# Patient Record
Sex: Female | Born: 1950 | Race: White | Hispanic: No | Smoking: Current every day smoker
Health system: Southern US, Community
[De-identification: ages and names within clinical notes are randomized; demographics above are authoritative.]

## PROBLEM LIST (undated history)

## (undated) DIAGNOSIS — M549 Dorsalgia, unspecified: Secondary | ICD-10-CM

## (undated) DIAGNOSIS — Z13820 Encounter for screening for osteoporosis: Secondary | ICD-10-CM

## (undated) DIAGNOSIS — N6011 Diffuse cystic mastopathy of right breast: Secondary | ICD-10-CM

## (undated) DIAGNOSIS — Z1231 Encounter for screening mammogram for malignant neoplasm of breast: Secondary | ICD-10-CM

## (undated) DIAGNOSIS — D721 Eosinophilia, unspecified: Secondary | ICD-10-CM

## (undated) DIAGNOSIS — N6012 Diffuse cystic mastopathy of left breast: Secondary | ICD-10-CM

## (undated) DIAGNOSIS — M4820 Kissing spine, site unspecified: Secondary | ICD-10-CM

## (undated) HISTORY — PX: ABDOMINAL HYSTERECTOMY: SHX81

---

## 2016-11-03 ENCOUNTER — Other Ambulatory Visit: Payer: Self-pay | Admitting: Obstetrics and Gynecology

## 2016-11-03 DIAGNOSIS — R5381 Other malaise: Secondary | ICD-10-CM

## 2016-11-03 DIAGNOSIS — Z1231 Encounter for screening mammogram for malignant neoplasm of breast: Secondary | ICD-10-CM

## 2016-11-08 ENCOUNTER — Other Ambulatory Visit: Payer: Self-pay | Admitting: Obstetrics and Gynecology

## 2016-11-08 DIAGNOSIS — M8580 Other specified disorders of bone density and structure, unspecified site: Secondary | ICD-10-CM

## 2016-12-02 ENCOUNTER — Ambulatory Visit
Admission: RE | Admit: 2016-12-02 | Discharge: 2016-12-02 | Disposition: A | Discharge status: Home / Self Care | Payer: BLUE CROSS/BLUE SHIELD | Source: Ambulatory Visit | Attending: Obstetrics and Gynecology | Admitting: Obstetrics and Gynecology

## 2016-12-02 DIAGNOSIS — M8580 Other specified disorders of bone density and structure, unspecified site: Secondary | ICD-10-CM

## 2016-12-02 DIAGNOSIS — Z1231 Encounter for screening mammogram for malignant neoplasm of breast: Secondary | ICD-10-CM

## 2017-05-25 DIAGNOSIS — J189 Pneumonia, unspecified organism: Secondary | ICD-10-CM | POA: Diagnosis not present

## 2018-07-02 ENCOUNTER — Encounter: Payer: Self-pay | Admitting: Emergency Medicine

## 2018-07-02 ENCOUNTER — Emergency Department (INDEPENDENT_AMBULATORY_CARE_PROVIDER_SITE_OTHER): Payer: Medicare HMO

## 2018-07-02 ENCOUNTER — Emergency Department
Admission: EM | Admit: 2018-07-02 | Discharge: 2018-07-02 | Disposition: A | Discharge status: Home / Self Care | Payer: Medicare HMO | Source: Home / Self Care

## 2018-07-02 ENCOUNTER — Other Ambulatory Visit: Payer: Self-pay

## 2018-07-02 ENCOUNTER — Telehealth: Payer: Self-pay | Admitting: Emergency Medicine

## 2018-07-02 DIAGNOSIS — M79641 Pain in right hand: Secondary | ICD-10-CM | POA: Diagnosis not present

## 2018-07-02 DIAGNOSIS — R918 Other nonspecific abnormal finding of lung field: Secondary | ICD-10-CM

## 2018-07-02 DIAGNOSIS — R079 Chest pain, unspecified: Secondary | ICD-10-CM | POA: Diagnosis not present

## 2018-07-02 DIAGNOSIS — R0789 Other chest pain: Secondary | ICD-10-CM

## 2018-07-02 MED ORDER — DICLOFENAC SODIUM 75 MG PO TBEC: 75.0000 mg | DELAYED_RELEASE_TABLET | Freq: Two times a day (BID) | ORAL | 0 refills | Status: AC

## 2018-07-02 MED ORDER — METHOCARBAMOL 500 MG PO TABS: 500.0000 mg | ORAL_TABLET | Freq: Two times a day (BID) | ORAL | 0 refills | Status: AC

## 2018-07-02 NOTE — Telephone Encounter (Signed)
Calling to say she does not have PCP and her insurance is cancelled as of 07/04/18. Told her we would see if we can find a Horticulturist, commercialcommunity health resource.

## 2018-07-02 NOTE — ED Triage Notes (Signed)
67 y.o female c/o left sided breast/chest pain. States this began two days ago after repetitively trying to start weed eater. States the pain is a dull ache and worsens upon taking a deep breathe. She is taking Tylenol PRN for the pain.

## 2018-07-02 NOTE — Discharge Instructions (Signed)
Schedule primary care appointment.  You need a follow up ct or chest xray.

## 2018-07-03 NOTE — ED Provider Notes (Signed)
[facility]    CSN: [id number] Arrival date & time: 07/02/18  0830     History   Chief Complaint Chief Complaint  Patient presents with  . Chest Pain    injured while starting weed eater 2 days ago    HPI Audrey Paul is a 67 y.o. female.   The history is provided by the patient. No language interpreter was used.  Chest Pain  Pain location:  L chest Pain quality: aching   Pain radiates to:  Does not radiate Timing:  Constant Progression:  Worsening Chronicity:  New Relieved by:  Nothing Worsened by:  Nothing Ineffective treatments:  None tried Pt reports she was pulling weed eater sting and has had pain in her chest since.   Pt reports pain with  A deep breath History reviewed. No pertinent past medical history.  There are no active problems to display for this patient.   Past Surgical History:  Procedure Laterality Date  . ABDOMINAL HYSTERECTOMY      OB History   None      Home Medications    Prior to Admission medications   Medication Sig Start Date End Date Taking? Authorizing Provider  diclofenac (VOLTAREN) 75 MG EC tablet Take 1 tablet (75 mg total) by mouth 2 (two) times daily. 07/02/18   Elson Areas, PA-C  methocarbamol (ROBAXIN) 500 MG tablet Take 1 tablet (500 mg total) by mouth 2 (two) times daily. 07/02/18   Elson Areas, PA-C    Family History Family History  Problem Relation Age of Onset  . Hypertension Mother   . Hypertension Father     Social History Social History   Tobacco Use  . Smoking status: Current Every Day Smoker    Types: Cigarettes  . Smokeless tobacco: Never Used  Substance Use Topics  . Alcohol use: Not Currently  . Drug use: Not Currently     Allergies   Zithromax [azithromycin]   Review of Systems Review of Systems  Cardiovascular: Positive for chest pain.  All other systems reviewed and are negative.    Physical Exam Triage Vital Signs ED Triage Vitals  Enc Vitals Group   BP 07/02/18 0851 120/78     Pulse Rate 07/02/18 0851 69     Resp --      Temp 07/02/18 0851 (!) 97.4 F (36.3 C)     Temp Source 07/02/18 0851 Oral     SpO2 07/02/18 0851 100 %     Weight 07/02/18 0852 98 lb 4.8 oz (44.6 kg)     Height --      Head Circumference --      Peak Flow --      Pain Score 07/02/18 0852 5     Pain Loc --      Pain Edu? --      Excl. in GC? --    No data found.  Updated Vital Signs BP 120/78 (BP Location: Right Arm)   Pulse 69   Temp (!) 97.4 F (36.3 C) (Oral)   Wt 98 lb 4.8 oz (44.6 kg)   SpO2 100%   Visual Acuity Right Eye Distance:   Left Eye Distance:   Bilateral Distance:    Right Eye Near:   Left Eye Near:    Bilateral Near:     Physical Exam  Constitutional: She is oriented to person, place, and time. She appears well-developed and well-nourished.  HENT:  Head: Normocephalic and atraumatic.  Eyes: EOM are normal.  Neck: Normal range of motion.  Cardiovascular: Normal rate, regular rhythm and normal pulses.  Pulmonary/Chest: She is in respiratory distress.  Abdominal: She exhibits no distension.  Musculoskeletal: Normal range of motion.  Neurological: She is alert and oriented to person, place, and time.  Psychiatric: She has a normal mood and affect.  Nursing note and vitals reviewed.    UC Treatments / Results  Labs (all labs ordered are listed, but only abnormal results are displayed) Labs Reviewed - No data to display  EKG None  Radiology Dg Chest 2 View  Result Date: 07/02/2018 CLINICAL DATA:  Chest pain for 1 week.  History of smoking. EXAM: CHEST - 2 VIEW COMPARISON:  None. FINDINGS: Heart size and mediastinal contours are within normal limits. Lungs are hyperexpanded. Coarse markings are seen throughout both lungs suggesting chronic interstitial lung disease. Small irregular nodular density within the LEFT upper lung. No confluent opacity to suggest a developing pneumonia. No pleural effusion or pneumothorax seen.  Mild degenerative spondylosis within the thoracic spine. Dextroscoliosis of the thoracic spine, mild to moderate in degree. No acute or suspicious osseous finding. IMPRESSION: 1. Small irregular nodular density within the LEFT upper lung, neoplastic nodule versus nodular scarring/fibrosis. Recommend chest CT for further characterization. 2. Hyperexpanded lungs indicating COPD. Probable associated chronic interstitial lung disease. Electronically Signed   By: Bary Richard M.D.   On: 07/02/2018 09:33   Dg Hand Complete Right  Result Date: 07/02/2018 CLINICAL DATA:  67 year old female with persistent right hand pain after breaking up fight between 2 dogs 7 weeks previously. EXAM: RIGHT HAND - COMPLETE 3+ VIEW COMPARISON:  None. FINDINGS: There is no evidence of fracture or dislocation. There is no evidence of arthropathy or other focal bone abnormality. Soft tissues are unremarkable. IMPRESSION: Negative. Electronically Signed   By: Malachy Moan M.D.   On: 07/02/2018 09:27    Procedures Procedures (including critical care time)  Medications Ordered in UC Medications - No data to display  Initial Impression / Assessment and Plan / UC Course  I have reviewed the triage vital signs and the nursing notes.  Pertinent labs & imaging results that were available during my care of the patient were reviewed by me and considered in my medical decision making (see chart for details).     Patient advised of lung nodule found on chest x-ray she reports she has been told in the past that she had a nodule but she was unable to follow-up.  I suspect patient's current pain is muscular I will treat her with Voltaren and Robaxin.  Patient is a [facility] resident.  Resources are obtained for referrals to [facility] clincs Final Clinical Impressions(s) / UC Diagnoses   Final diagnoses:  Abnormal chest x-ray with multiple lung nodules  Chest wall pain     Discharge Instructions     Schedule  primary care appointment.  You need a follow up ct or chest xray.    ED Prescriptions    Medication Sig Dispense Auth. Provider   diclofenac (VOLTAREN) 75 MG EC tablet Take 1 tablet (75 mg total) by mouth 2 (two) times daily. 20 tablet Ramaj Frangos K, [place]   methocarbamol (ROBAXIN) 500 MG tablet Take 1 tablet (500 mg total) by mouth 2 (two) times daily. 20 tablet Elson Areas, [place]     Controlled Substance Prescriptions [place] Controlled Substance Registry consulted? Not Applicable  An After Visit Summary was printed and given to the patient.    Elson Areas,  PA-C 07/03/18 1119

## 2019-01-22 DIAGNOSIS — Z13820 Encounter for screening for osteoporosis: Secondary | ICD-10-CM

## 2019-01-23 ENCOUNTER — Inpatient Hospital Stay: Admit: 2019-01-23 | Payer: MEDICARE | Primary: Family Medicine

## 2019-01-23 LAB — COMPREHENSIVE METABOLIC PANEL
ALT: 32 U/L (ref 12–78)
AST: 20 U/L (ref 15–37)
Albumin: 4.3 gm/dl (ref 3.4–5.0)
Alkaline Phosphatase: 58 U/L (ref 45–117)
Anion Gap: 4 mmol/L — ABNORMAL LOW (ref 5–15)
BUN: 15 mg/dl (ref 7–25)
CO2: 29 mEq/L (ref 21–32)
Calcium: 8.9 mg/dl (ref 8.5–10.1)
Chloride: 109 mEq/L — ABNORMAL HIGH (ref 98–107)
Creatinine: 0.7 mg/dl (ref 0.6–1.3)
EGFR IF NonAfrican American: >60
GFR African American: >60
Glucose: 91 mg/dl (ref 74–106)
Potassium: 4.8 mEq/L (ref 3.5–5.1)
Sodium: 142 mEq/L (ref 136–145)
Total Bilirubin: 1 mg/dl (ref 0.2–1.0)
Total Protein: 6.6 gm/dl (ref 6.4–8.2)

## 2019-01-23 LAB — CBC WITH AUTO DIFFERENTIAL
Basophils %: 1.1 % (ref 0–3)
Eosinophils %: 15.2 % — ABNORMAL HIGH (ref 0–5)
Hematocrit: 39.5 % (ref 37.0–50.0)
Hemoglobin: 13.2 gm/dl (ref 13.0–17.2)
Immature Granulocytes: 0.2 % (ref 0.0–3.0)
Lymphocytes %: 28.8 % (ref 28–48)
MCH: 31.6 pg (ref 25.4–34.6)
MCHC: 33.4 gm/dl (ref 30.0–36.0)
MCV: 94.5 fL (ref 80.0–98.0)
MPV: 9.9 fL (ref 6.0–10.0)
Monocytes %: 6.2 % (ref 1–13)
Neutrophils %: 48.5 % (ref 34–64)
Nucleated RBCs: 0 (ref 0–0)
Platelets: 258 10*3/uL (ref 140–450)
RBC: 4.18 M/uL (ref 3.60–5.20)
RDW-SD: 46.6 — ABNORMAL HIGH (ref 36.4–46.3)
WBC: 5.3 10*3/uL (ref 4.0–11.0)

## 2019-01-23 LAB — LIPID PANEL
CHOL/HDL Ratio: 3.5 Ratio (ref 0.0–4.4)
Chol/HDL Ratio: 3.5 Ratio (ref 0.0–4.4)
Cholesterol, Total: 202 mg/dl — ABNORMAL HIGH (ref 140–199)
Cholesterol, total: 202 mg/dl — ABNORMAL HIGH (ref 140–199)
HDL Cholesterol: 58 mg/dl (ref 40–96)
HDL: 58 mg/dl (ref 40–96)
LDL Calculated: 122 mg/dl (ref 0–130)
LDL, calculated: 122 mg/dl (ref 0–130)
Triglyceride: 111 mg/dl (ref 29–150)
Triglycerides: 111 mg/dl (ref 29–150)

## 2019-01-23 LAB — VITAMIN D 25 HYDROXY: Vitamin D, 25-OH, Total: 38.7 ng/ml (ref 30.0–100.0)

## 2019-01-23 LAB — THYROID CASCADE PROFILE
TSH: 2.27 m[IU]/mL (ref 0.358–3.740)
TSH: 2.27 m[IU]/mL (ref 0.358–3.740)

## 2019-01-23 LAB — CBC WITH AUTOMATED DIFF
BASOPHILS: 1.1 % (ref 0–3)
EOSINOPHILS: 15.2 % — ABNORMAL HIGH (ref 0–5)
HCT: 39.5 % (ref 37.0–50.0)
HGB: 13.2 gm/dl (ref 13.0–17.2)
IMMATURE GRANULOCYTES: 0.2 % (ref 0.0–3.0)
LYMPHOCYTES: 28.8 % (ref 28–48)
MCH: 31.6 pg (ref 25.4–34.6)
MCHC: 33.4 gm/dl (ref 30.0–36.0)
MCV: 94.5 fL (ref 80.0–98.0)
MONOCYTES: 6.2 % (ref 1–13)
MPV: 9.9 fL (ref 6.0–10.0)
NEUTROPHILS: 48.5 % (ref 34–64)
NRBC: 0 (ref 0–0)
PLATELET: 258 10*3/uL (ref 140–450)
RBC: 4.18 M/uL (ref 3.60–5.20)
RDW-SD: 46.6 — ABNORMAL HIGH (ref 36.4–46.3)
WBC: 5.3 10*3/uL (ref 4.0–11.0)

## 2019-01-23 LAB — METABOLIC PANEL, COMPREHENSIVE
ALT (SGPT): 32 U/L (ref 12–78)
AST (SGOT): 20 U/L (ref 15–37)
Albumin: 4.3 gm/dl (ref 3.4–5.0)
Alk. phosphatase: 58 U/L (ref 45–117)
Anion gap: 4 mmol/L — ABNORMAL LOW (ref 5–15)
BUN: 15 mg/dl (ref 7–25)
Bilirubin, total: 1 mg/dl (ref 0.2–1.0)
CO2: 29 mEq/L (ref 21–32)
Calcium: 8.9 mg/dl (ref 8.5–10.1)
Chloride: 109 mEq/L — ABNORMAL HIGH (ref 98–107)
Creatinine: 0.7 mg/dl (ref 0.6–1.3)
GFR est AA: >60
GFR est non-AA: >60
Glucose: 91 mg/dl (ref 74–106)
Potassium: 4.8 mEq/L (ref 3.5–5.1)
Protein, total: 6.6 gm/dl (ref 6.4–8.2)
Sodium: 142 mEq/L (ref 136–145)

## 2019-01-23 LAB — VITAMIN D, 25 HYDROXY: Vitamin D, 25-OH, Total: 38.7 ng/ml (ref 30.0–100.0)

## 2019-01-24 ENCOUNTER — Encounter

## 2019-01-25 ENCOUNTER — Encounter

## 2019-02-08 ENCOUNTER — Inpatient Hospital Stay: Admit: 2019-02-08 | Payer: MEDICARE | Attending: Family Medicine | Primary: Family Medicine

## 2019-02-08 ENCOUNTER — Inpatient Hospital Stay: Payer: MEDICARE | Attending: Family Medicine | Primary: Family Medicine

## 2019-02-08 DIAGNOSIS — N6011 Diffuse cystic mastopathy of right breast: Secondary | ICD-10-CM

## 2019-06-26 IMAGING — DX DG CHEST 2V
2 series · 2 of 2 positions shown · non-contrast
Comparison: None.

CLINICAL DATA: Chest pain for 1 week.  History of smoking.

EXAM:
CHEST - 2 VIEW

[chest pa]
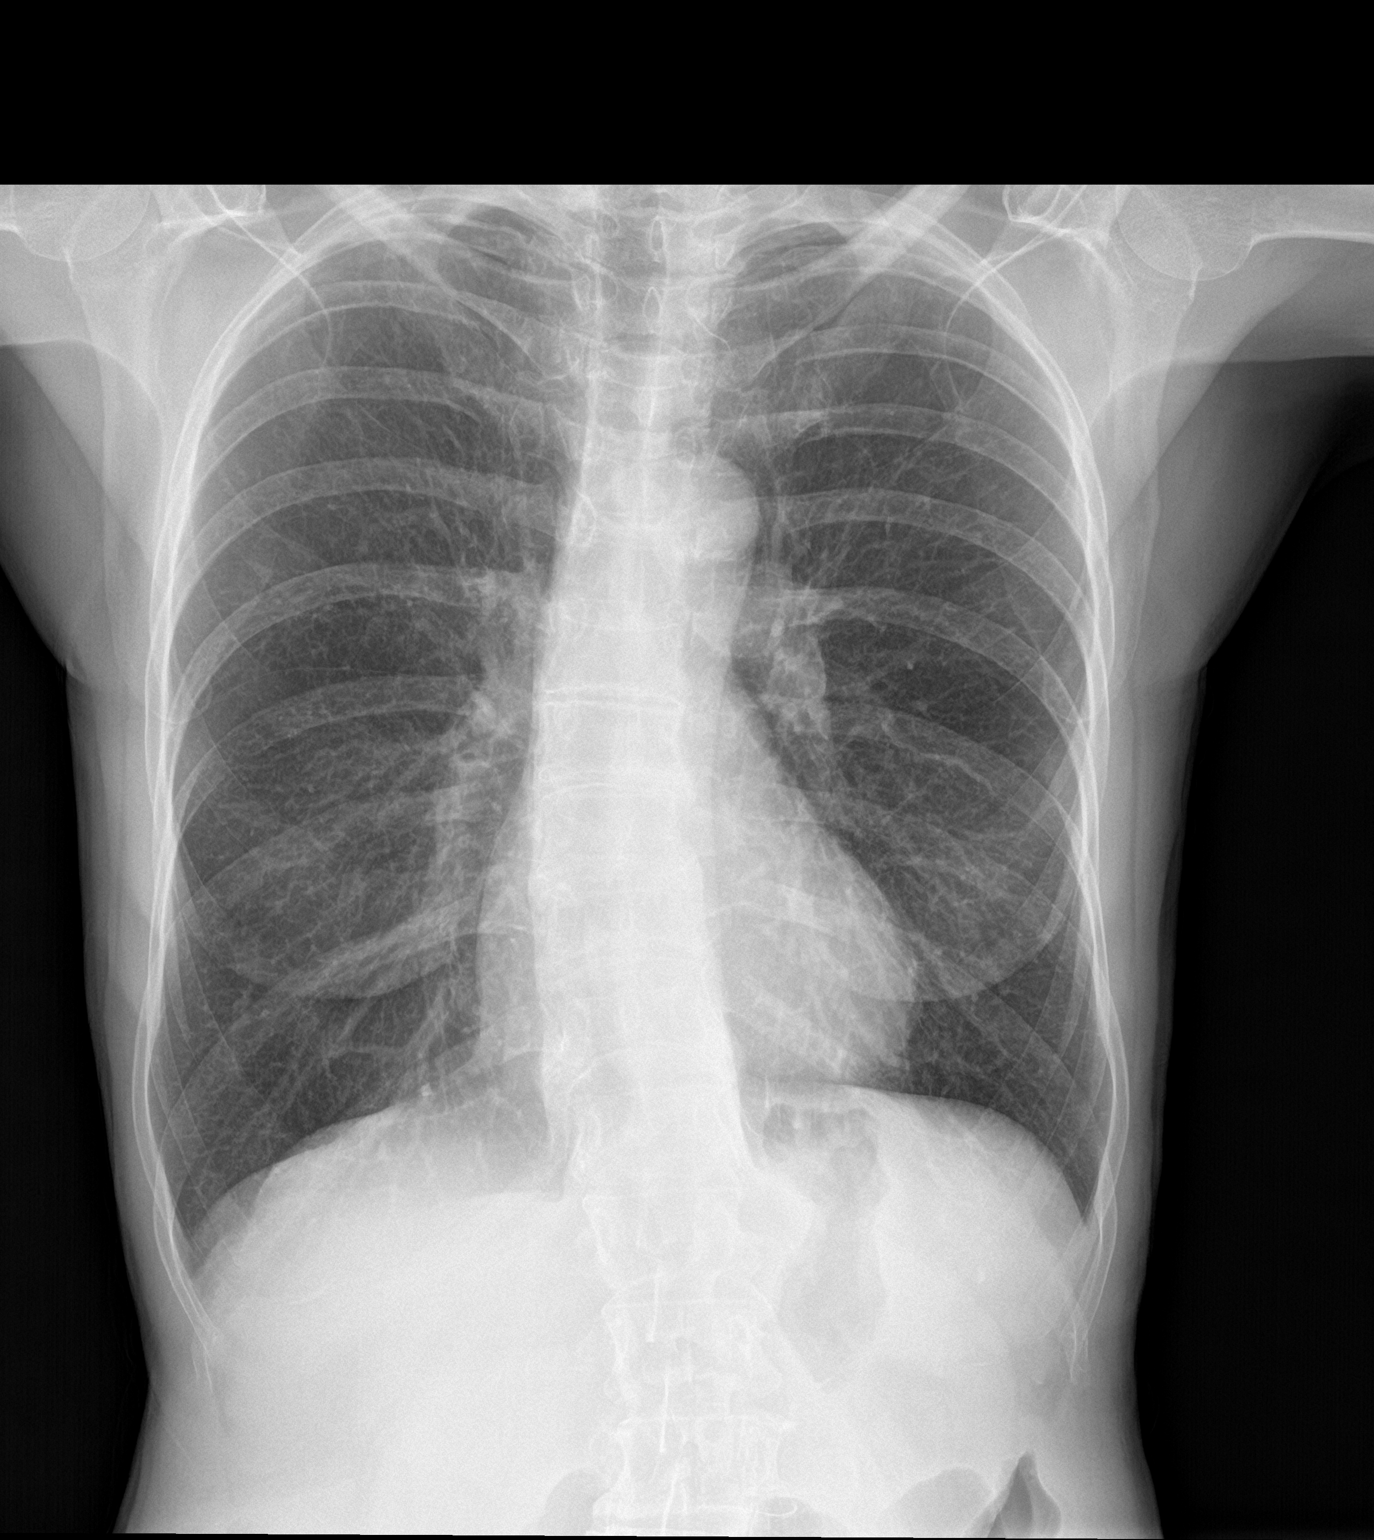

[chest lat]
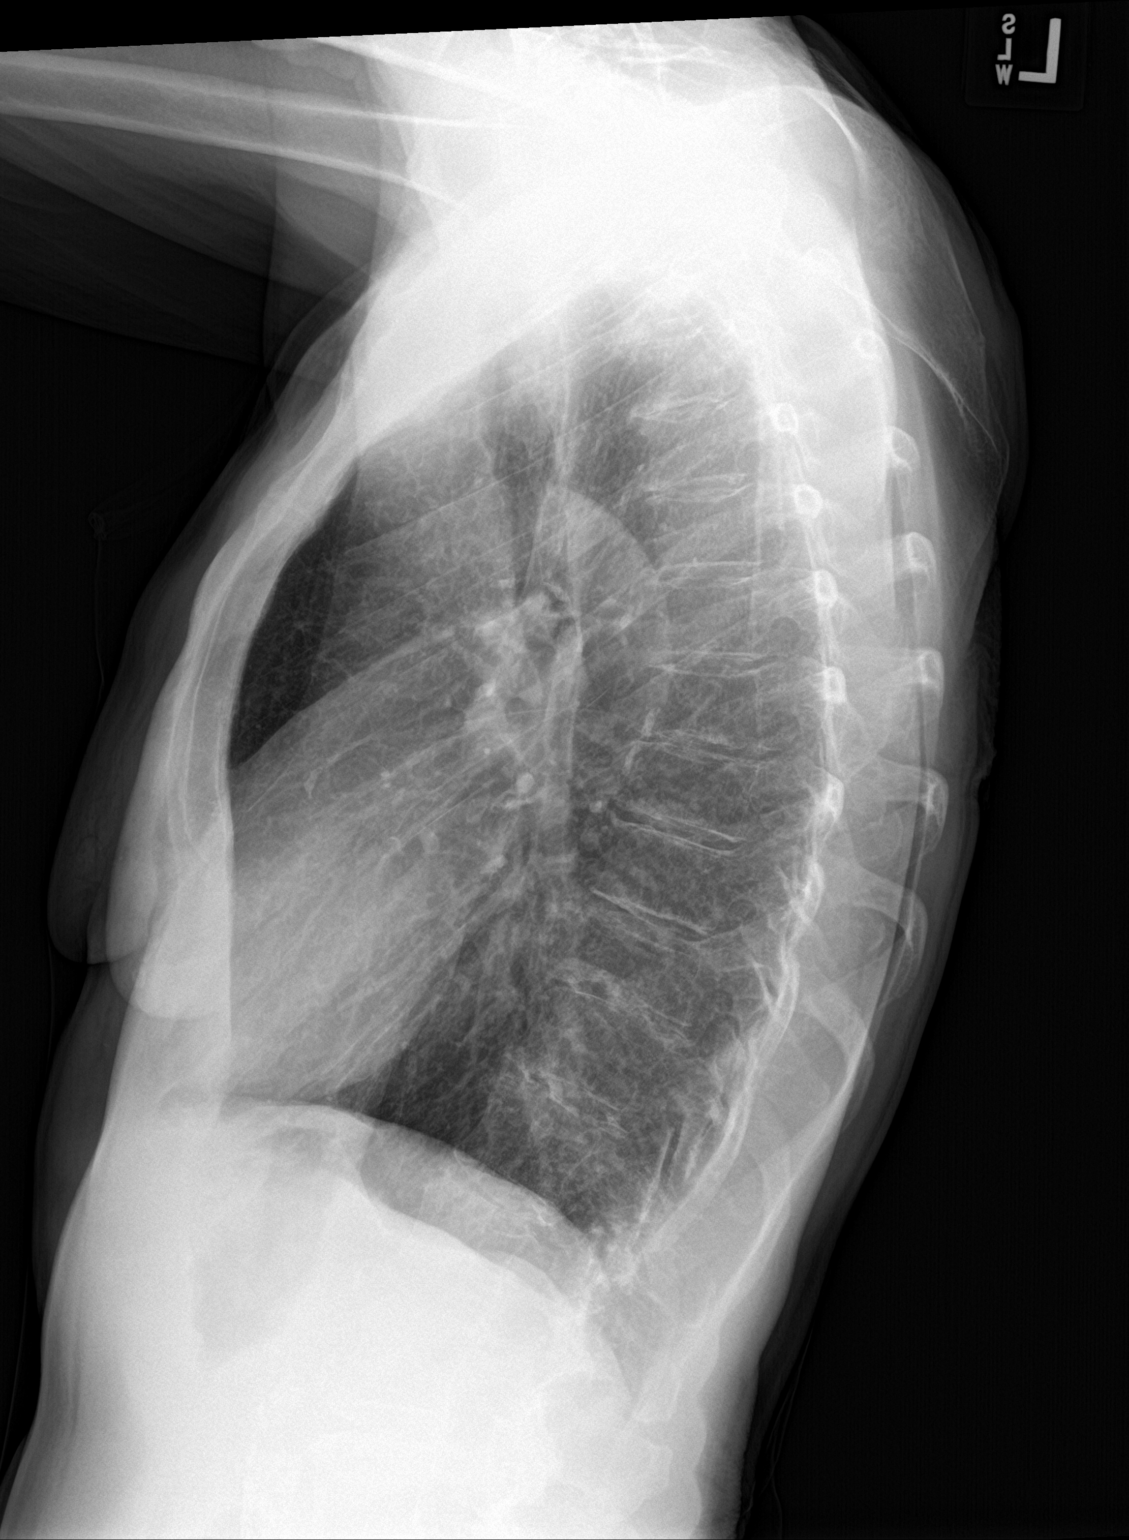

[2 of 2 positions shown; findings below may reference images not displayed]

FINDINGS: Heart size and mediastinal contours are within normal limits. Lungs
are hyperexpanded. Coarse markings are seen throughout both lungs
suggesting chronic interstitial lung disease. Small irregular
nodular density within the LEFT upper lung. No confluent opacity to
suggest a developing pneumonia. No pleural effusion or pneumothorax
seen.

Mild degenerative spondylosis within the thoracic spine.
Dextroscoliosis of the thoracic spine, mild to moderate in degree.
No acute or suspicious osseous finding.
IMPRESSION: 1. Small irregular nodular density within the LEFT upper lung,
neoplastic nodule versus nodular scarring/fibrosis. Recommend chest
CT for further characterization.
2. Hyperexpanded lungs indicating COPD. Probable associated chronic
interstitial lung disease.

## 2019-06-26 IMAGING — DX DG HAND COMPLETE 3+V*R*
3 series · 3 of 3 positions shown · non-contrast
Comparison: None.

CLINICAL DATA: 67-year-old female with persistent right hand pain
after breaking up fight between 2 dogs 7 weeks previously.

EXAM:
RIGHT HAND - COMPLETE 3+ VIEW

[hand pa]
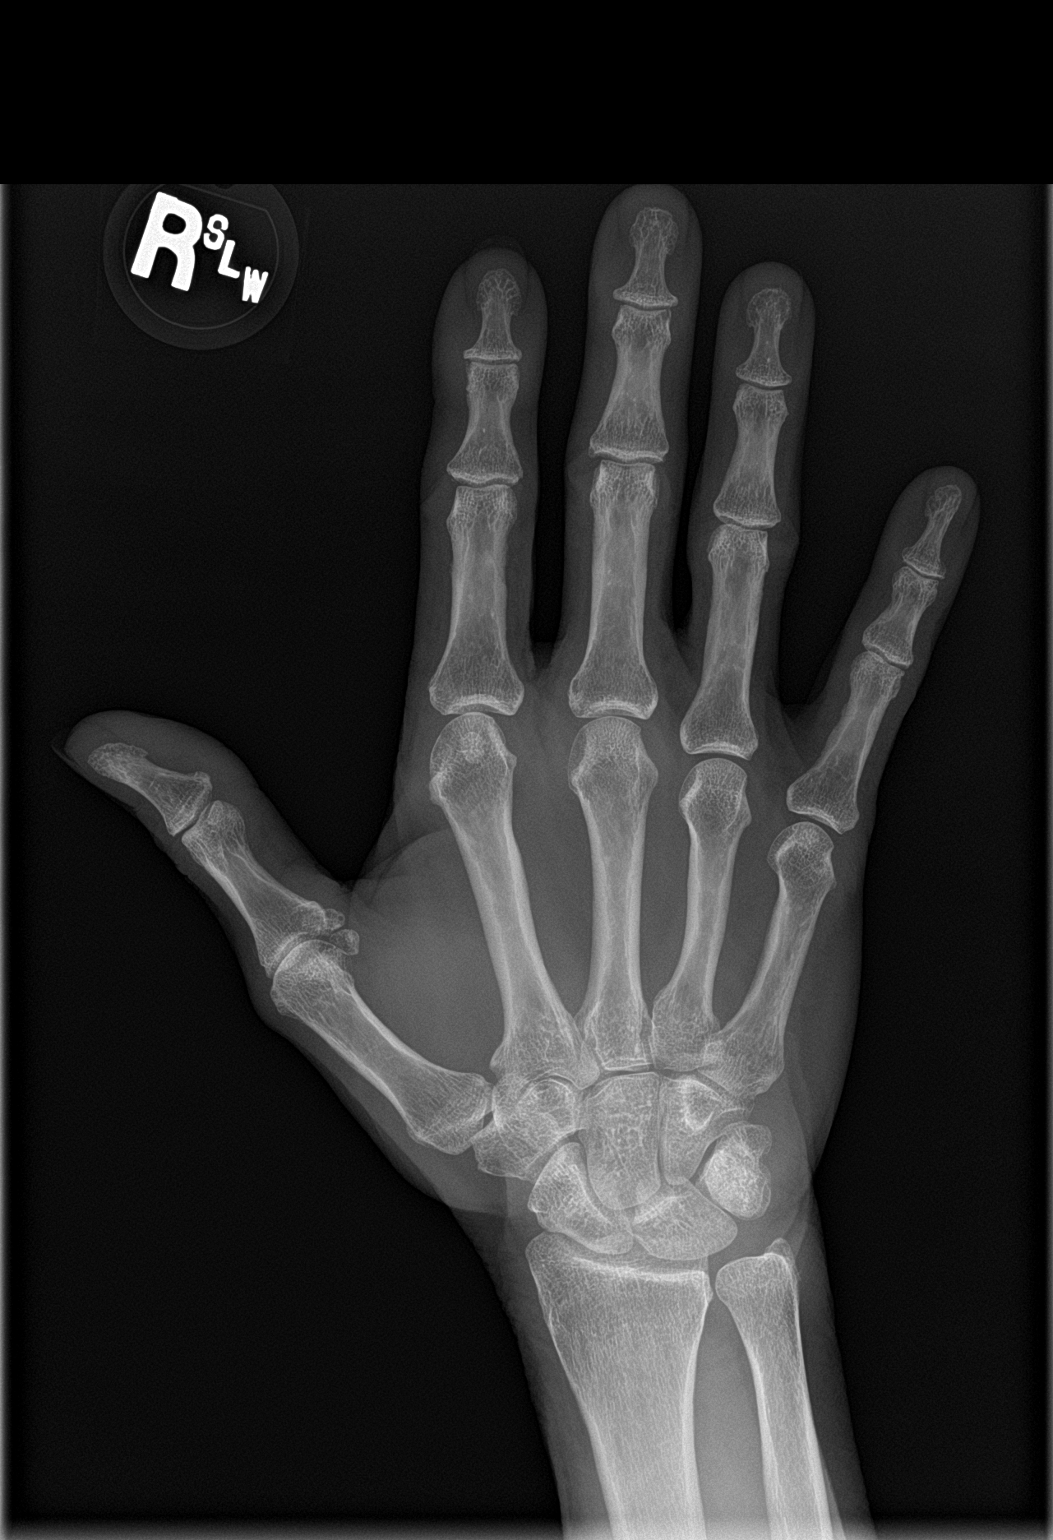

[hand obl]
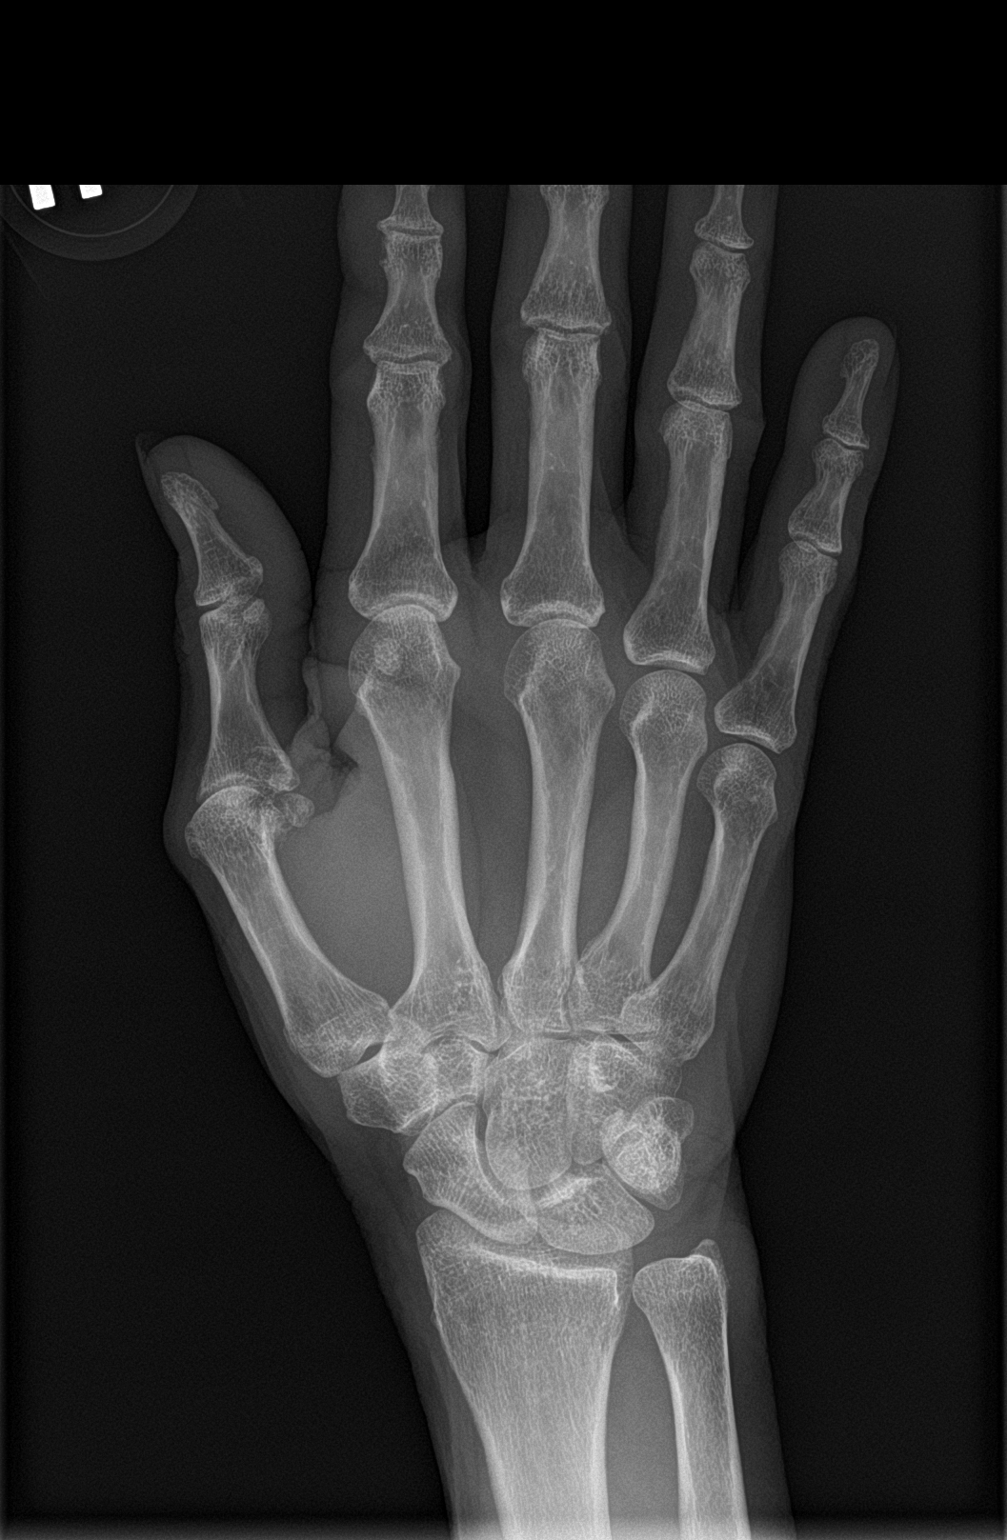

[hand lat]
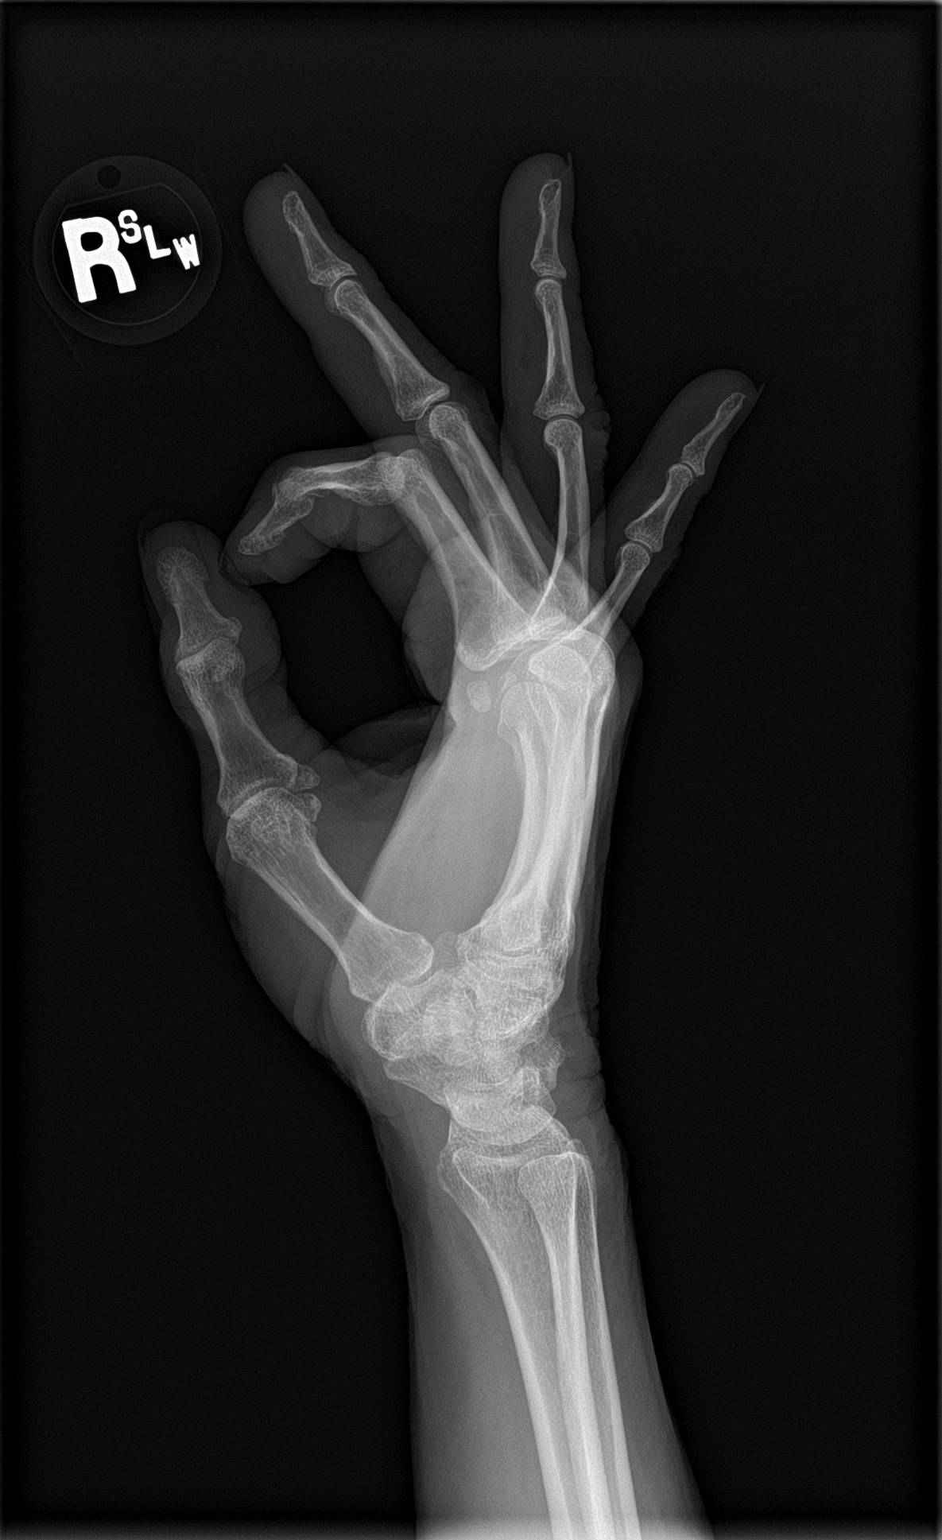

[3 of 3 positions shown; findings below may reference images not displayed]

FINDINGS: There is no evidence of fracture or dislocation. There is no
evidence of arthropathy or other focal bone abnormality. Soft
tissues are unremarkable.
IMPRESSION: Negative.

## 2019-07-04 ENCOUNTER — Inpatient Hospital Stay: Admit: 2019-07-04 | Payer: MEDICARE | Primary: Family Medicine

## 2019-07-04 DIAGNOSIS — E559 Vitamin D deficiency, unspecified: Secondary | ICD-10-CM

## 2019-07-04 LAB — CBC WITH AUTO DIFFERENTIAL
Basophils %: 1 % (ref 0–3)
Eosinophils %: 14.5 % — ABNORMAL HIGH (ref 0–5)
Hematocrit: 42.3 % (ref 37.0–50.0)
Hemoglobin: 14 gm/dl (ref 13.0–17.2)
Immature Granulocytes: 0.2 % (ref 0.0–3.0)
Lymphocytes %: 26 % — ABNORMAL LOW (ref 28–48)
MCH: 31.5 pg (ref 25.4–34.6)
MCHC: 33.1 gm/dl (ref 30.0–36.0)
MCV: 95.1 fL (ref 80.0–98.0)
MPV: 9.7 fL (ref 6.0–10.0)
Monocytes %: 7.2 % (ref 1–13)
Neutrophils %: 51.1 % (ref 34–64)
Nucleated RBCs: 0 (ref 0–0)
Platelets: 279 10*3/uL (ref 140–450)
RBC: 4.45 M/uL (ref 3.60–5.20)
RDW-SD: 46.4 — ABNORMAL HIGH (ref 36.4–46.3)
WBC: 6.3 10*3/uL (ref 4.0–11.0)

## 2019-07-04 LAB — LIPID PANEL
CHOL/HDL Ratio: 3.3 Ratio (ref 0.0–4.4)
Chol/HDL Ratio: 3.3 Ratio (ref 0.0–4.4)
Cholesterol, Total: 199 mg/dl (ref 140–199)
Cholesterol, total: 199 mg/dl (ref 140–199)
HDL Cholesterol: 61 mg/dl (ref 40–96)
HDL: 61 mg/dl (ref 40–96)
LDL Calculated: 123 mg/dl (ref 0–130)
LDL, calculated: 123 mg/dl (ref 0–130)
Triglyceride: 75 mg/dl (ref 29–150)
Triglycerides: 75 mg/dl (ref 29–150)

## 2019-07-04 LAB — COMPREHENSIVE METABOLIC PANEL
ALT: 33 U/L (ref 12–78)
AST: 23 U/L (ref 15–37)
Albumin: 4.1 gm/dl (ref 3.4–5.0)
Alkaline Phosphatase: 57 U/L (ref 45–117)
Anion Gap: 2 mmol/L — ABNORMAL LOW (ref 5–15)
BUN: 16 mg/dl (ref 7–25)
CO2: 28 mEq/L (ref 21–32)
Calcium: 8.8 mg/dl (ref 8.5–10.1)
Chloride: 109 mEq/L — ABNORMAL HIGH (ref 98–107)
Creatinine: 0.8 mg/dl (ref 0.6–1.3)
EGFR IF NonAfrican American: >60
GFR African American: >60
Glucose: 86 mg/dl (ref 74–106)
Potassium: 4.6 mEq/L (ref 3.5–5.1)
Sodium: 138 mEq/L (ref 136–145)
Total Bilirubin: 0.6 mg/dl (ref 0.2–1.0)
Total Protein: 7.2 gm/dl (ref 6.4–8.2)

## 2019-07-04 LAB — VITAMIN D 25 HYDROXY: Vitamin D, 25-OH, Total: 41.6 ng/ml (ref 30.0–100.0)

## 2019-07-04 LAB — THYROID CASCADE PROFILE
TSH: 2.32 m[IU]/mL (ref 0.358–3.740)
TSH: 2.32 m[IU]/mL (ref 0.358–3.740)

## 2019-07-04 LAB — METABOLIC PANEL, COMPREHENSIVE
ALT (SGPT): 33 U/L (ref 12–78)
AST (SGOT): 23 U/L (ref 15–37)
Albumin: 4.1 gm/dl (ref 3.4–5.0)
Alk. phosphatase: 57 U/L (ref 45–117)
Anion gap: 2 mmol/L — ABNORMAL LOW (ref 5–15)
BUN: 16 mg/dl (ref 7–25)
Bilirubin, total: 0.6 mg/dl (ref 0.2–1.0)
CO2: 28 mEq/L (ref 21–32)
Calcium: 8.8 mg/dl (ref 8.5–10.1)
Chloride: 109 mEq/L — ABNORMAL HIGH (ref 98–107)
Creatinine: 0.8 mg/dl (ref 0.6–1.3)
GFR est AA: >60
GFR est non-AA: >60
Glucose: 86 mg/dl (ref 74–106)
Potassium: 4.6 mEq/L (ref 3.5–5.1)
Protein, total: 7.2 gm/dl (ref 6.4–8.2)
Sodium: 138 mEq/L (ref 136–145)

## 2019-07-04 LAB — CBC WITH AUTOMATED DIFF
BASOPHILS: 1 % (ref 0–3)
EOSINOPHILS: 14.5 % — ABNORMAL HIGH (ref 0–5)
HCT: 42.3 % (ref 37.0–50.0)
HGB: 14 gm/dl (ref 13.0–17.2)
IMMATURE GRANULOCYTES: 0.2 % (ref 0.0–3.0)
LYMPHOCYTES: 26 % — ABNORMAL LOW (ref 28–48)
MCH: 31.5 pg (ref 25.4–34.6)
MCHC: 33.1 gm/dl (ref 30.0–36.0)
MCV: 95.1 fL (ref 80.0–98.0)
MONOCYTES: 7.2 % (ref 1–13)
MPV: 9.7 fL (ref 6.0–10.0)
NEUTROPHILS: 51.1 % (ref 34–64)
NRBC: 0 (ref 0–0)
PLATELET: 279 10*3/uL (ref 140–450)
RBC: 4.45 M/uL (ref 3.60–5.20)
RDW-SD: 46.4 — ABNORMAL HIGH (ref 36.4–46.3)
WBC: 6.3 10*3/uL (ref 4.0–11.0)

## 2019-07-04 LAB — VITAMIN D, 25 HYDROXY: Vitamin D, 25-OH, Total: 41.6 ng/ml (ref 30.0–100.0)

## 2020-01-30 ENCOUNTER — Inpatient Hospital Stay: Admit: 2020-01-30 | Payer: MEDICARE | Primary: Family Medicine

## 2020-01-30 ENCOUNTER — Encounter

## 2020-01-30 DIAGNOSIS — M47816 Spondylosis without myelopathy or radiculopathy, lumbar region: Secondary | ICD-10-CM

## 2020-12-16 ENCOUNTER — Encounter

## 2020-12-16 ENCOUNTER — Inpatient Hospital Stay: Admit: 2020-12-16 | Payer: MEDICARE | Primary: Family Medicine

## 2020-12-16 DIAGNOSIS — R079 Chest pain, unspecified: Secondary | ICD-10-CM

## 2021-01-02 ENCOUNTER — Encounter

## 2021-01-26 ENCOUNTER — Encounter

## 2021-01-26 ENCOUNTER — Inpatient Hospital Stay: Admit: 2021-01-26 | Payer: MEDICARE | Attending: Family Medicine | Primary: Family Medicine

## 2021-01-26 DIAGNOSIS — Z1231 Encounter for screening mammogram for malignant neoplasm of breast: Secondary | ICD-10-CM

## 2022-03-10 ENCOUNTER — Encounter

## 2022-03-13 ENCOUNTER — Inpatient Hospital Stay: Admit: 2022-03-13 | Payer: MEDICARE | Primary: Family Medicine

## 2022-03-13 DIAGNOSIS — M4820 Kissing spine, site unspecified: Secondary | ICD-10-CM

## 2022-03-31 ENCOUNTER — Ambulatory Visit
Admit: 2022-03-31 | Discharge: 2022-03-31 | Payer: MEDICARE | Attending: Physical Medicine & Rehabilitation | Primary: Family Medicine

## 2022-03-31 DIAGNOSIS — M48062 Spinal stenosis, lumbar region with neurogenic claudication: Secondary | ICD-10-CM

## 2022-03-31 NOTE — Patient Instructions (Signed)
Stuart Mcgill's Big three exercises link:  [email or web address]

## 2022-03-31 NOTE — Progress Notes (Signed)
Marie Bryant, Marie Bryant, Marie Bryant, Marie Bryant  DOB: 09/26/1951  PCP: Marie SinkSamir Abdelshaheed, MD  03/31/2022    NEW PATIENT    HISTORY OF PRESENT ILLNESS  Marie Bryant is a 71 y.o. female c/o lower back pain for the last few years.  Her pain is reproduced upon straightening of her spine. She also has muscle cramps in her legs at times, that seem to come in waves and build along her legs. She finds relief upon movement. She also finds relief upon certain positional movements with lumbar flexion, but notes she mainly has pain upon sitting. She also has difficulty walking up stairs due to pain, and has tripped often. She also notes of an episode of right lower back pain, where it felt like there was a knife in her back this morning. Notes of numbness radiating down posterior both legs to her feet, especially upon walking, that started a few months ago. Also c/o burning pain of both feet. She notes she previously fell and injured her foot in the past. States hx of meniscal tears. She also had numbness that radiates down right arm, specifically index and middle finger. She notes of an episode of numbness of her right leg after sitting for a prolonged period of time in the car. She has attended PT, and maintains HEP. Notes that [facility] for eval of back pain. She typically sleeps on her stomach or on her right side. Pt states she is not interested in injection treatment options. Notes she takes Mobic 7.5 mg with minimal relief. States she does not like to take medication. Pt notes that she ceased smoking. 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 Lumbar MRI images dated 03/31/22 were reviewed. Per report, spondylolisthesis, scoliosis with multilevel degenerative disc and joint disease causing central stenosis and foraminal  stenosis/nerve root compression as described. Resultant severe central stenosis at L4-L5.    Social Hx: Pt has a PhD in Immunology. She walks for exercise, and has a [place]. She has not ceased smoking.     PMP reviewed.    REVIEW OF SYSTEMS  Review of Systems   Constitutional:  Negative for fever and unexpected weight change.   HENT:  Negative for trouble swallowing.    Eyes:  Negative for visual disturbance.   Respiratory:  Negative for shortness of breath and wheezing.    Gastrointestinal:  Negative for nausea and vomiting.   Genitourinary:  Negative for enuresis.   Musculoskeletal:  Positive for back pain and myalgias.   Neurological:  Positive for numbness (BLE). Negative for dizziness and headaches.   Psychiatric/Behavioral:  Negative for sleep disturbance. The patient is not nervous/anxious.         SPINE/MUSCULOSKELETAL EXAM  Back Exam     Tenderness   The patient is experiencing tenderness in the lumbar.    Range of Motion   Extension:  normal   Flexion:  normal   Rotation right:  normal   Rotation left:  normal     Tests   Straight leg raise right: negative  Straight leg raise left: negative    Reflexes   Patellar:  2/4  Achilles:  2/4  Biceps:  2/4    Other   Toe walk: normal  Heel walk: normal  Sensation: normal           MOTOR:      Elbow Flex  Elbow Ext Arm Abd Wrist Ext Wrist Flex Hand Intrin   Right 5/5 5/5 5/5 5/5 5/5 5/5   Left 5/5 5/5 5/5 5/5 5/5 5/5             Hip flex  Knee Ext EHL Ankle DF Ankle PF      Right 5/5 5/5 5/5 5/5 5/5    Left 5/5 5/5 5/5 5/5 5/5        Ambulation without assistive device. FWB.    ASSESSMENT  Marie Bryant is a 71 y.o. female with lower back pain with radiation and paraesthesia of BLE. Her symptoms are likely due to lumbar spinal stenosis with neurogenic claudication (severe canal narrowing L4-5, moderate at L5-S1). Upon assessment, patient is neurologically intact with normal strength. Sensation is mildly decreased at the right L5 distribution.  Tenderness at the right PSIS could be related vs SI joint pathology.     PLAN  We discussed and provided Lu Duffel McGill's Big Three exercises for pt to add to her HEP to improve core-strengthening  We discussed and offered injection treatment (ESI), surgical consult and potential medication options, but pt is not interested in these modalities at this time.   Maintain HEP    Follow-up and Dispositions    Return if symptoms worsen or fail to improve.         Marie Bryant was seen today for lower back pain and leg pain.    Diagnoses and all orders for this visit:    Spinal stenosis of lumbar region with neurogenic claudication    Lumbar pain    Numbness and tingling of both lower extremities    Myofascial pain           CHIEF COMPLAINT  Marie Bryant is seen today in consultation at the request of Marie Sink, MD for complaints of chronic lower back pain with numbness and tingling of legs.          PAST MEDICAL HISTORY   History reviewed. No pertinent past medical history.    History reviewed. No pertinent surgical history.    MEDICATIONS    Current Outpatient Medications   Medication Sig Dispense Refill    Acetaminophen (TYLENOL PO) Take by mouth      EPINEPHrine (EPIPEN) 0.3 MG/0.3ML SOAJ injection epinephrine 0.3 mg/0.3 mL injection, auto-injector   USE 1 (ONE) MILLILITER BY INJECTION AS NEEDED      hydrOXYzine HCl (ATARAX) 10 MG tablet hydroxyzine HCl 10 mg tablet   TAKE 1 TO 2 TABLETS BY MOUTH TWICE DAILY AS NEEDED      omeprazole (PRILOSEC) 20 MG delayed release capsule omeprazole 20 mg capsule,delayed release   TAKE 1 CAPSULE BY MOUTH ONCE DAILY      triamcinolone (KENALOG) 0.1 % cream triamcinolone acetonide 0.1 % topical cream   APPLY ONE APPLICATION TO THE AFFECTED AREA DAILY      meloxicam (MOBIC) 7.5 MG tablet Take 1 tablet by mouth daily       No current facility-administered medications for this visit.       ALLERGIES    Allergies   Allergen Reactions    Alendronate      Pt stated myalgias was her  reaction    Azithromycin     Ibandronic Acid Palpitations     Pt stated she had chest pain  SOCIAL HISTORY    Social History     Socioeconomic History    Marital status: Divorced     Spouse name: None    Number of children: None    Years of education: None    Highest education level: None   Tobacco Use    Smoking status: Every Day     Types: Cigarettes   Vaping Use    Vaping Use: Never used   Substance and Sexual Activity    Alcohol use: Never    Drug use: Never       FAMILY HISTORY    Family History   Problem Relation Age of Onset    Cancer Sister        VITALS   There were no vitals taken for this visit.    PAIN ASSESSMENT     AMB PAIN ASSESSMENT 03/31/2022   Location of Pain Back   Location Modifiers Right;Left   Duration of Pain Persistent   Frequency of Pain Constant         RADIOGRAPHS  Lumbar MRI images taken on 03/31/22 personally reviewed with patient:  Exaggeration of the lumbar lordosis with grade 1 anterior spondylolisthesis of  L4 on L5 and L5 on S1. Mild kyphosis at lower thoracic segment. Scoliosis, left  convexity. No focal compression deformity. Discogenic endplate edema at Y5-W3 on  the left, L4-L5 on the right, anteriorly at L1-L2. Conus medullaris ends at L1  with normal morphology and signal intensity.     L1-L2: Posterior lateral disc protrusion and endplate spondylosis. No central  stenosis. Mild foraminal stenosis. Trace facet joint effusion with mild facet  hypertrophy.     L2-L3: Posterior disc bulge with no central stenosis. Facet and ligamentous  hypertrophy with mild foraminal stenosis. Slightly worse on the right.     L3-L4: Slightly worse posterior disc bulge with endplate spondylosis. Facet and  ligamentous hypertrophy. No central stenosis. Disc material contacts the exiting  L3 nerve roots bilaterally, more conspicuous on the right. No compression or  significant foraminal stenosis.     L4-L5: Posterior disc bulge. More severe facet and ligamentous hypertrophy.  Facet  arthropathy / inflammation with joint effusion. Severe central stenosis.  With spondylolisthesis and disc bulge, severe foraminal stenosis with  compression of exiting L4 nerve roots, slightly worse on the right.     L5-S1: Posterior disc bulge. Facet and ligamentous hypertrophy with facet  inflammation, arthropathy. Moderate central stenosis. Moderate foraminal  stenosis, worse on the left. Mild compression of exiting L5 nerve root possible.     Enlargement of spinous processes throughout with contact and interspinous  inflammation which can be seen in Baastrup's disease, worst at L4-L5.     IMPRESSION:  Spondylolisthesis, scoliosis with multilevel degenerative disc and  joint disease causing central stenosis and foraminal stenosis/nerve root  compression as described. Resultant severe central stenosis at L4-L5.      65 minutes of face-to-face contact were spent with the patient during today's visit extensively discussing symptoms and treatment plan.  All questions were answered. More than half of this visit today was spent on counseling.      Written by Kizzie Furnish, [place], as dictated by Dr. Teresa Pelton.

## 2023-10-05 ENCOUNTER — Encounter: Payer: Self-pay | Admitting: Emergency Medicine
